# Patient Record
Sex: Male | Born: 1995 | Race: Black or African American | Hispanic: No | Marital: Single | Smoking: Never smoker
Health system: Southern US, Community
[De-identification: ages and names within clinical notes are randomized; demographics above are authoritative.]

## PROBLEM LIST (undated history)

## (undated) DIAGNOSIS — J45909 Unspecified asthma, uncomplicated: Secondary | ICD-10-CM

## (undated) HISTORY — PX: CIRCUMCISION: SUR203

---
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
## 2016-02-16 ENCOUNTER — Encounter: Payer: Self-pay | Admitting: Family Medicine

## 2016-02-16 ENCOUNTER — Ambulatory Visit (INDEPENDENT_AMBULATORY_CARE_PROVIDER_SITE_OTHER): Payer: No Typology Code available for payment source | Admitting: Family Medicine

## 2016-02-16 VITALS — BP 114/70 | HR 64 | Temp 97.8°F | Resp 16 | Ht 65.0 in | Wt 157.0 lb

## 2016-02-16 DIAGNOSIS — Z23 Encounter for immunization: Secondary | ICD-10-CM | POA: Diagnosis not present

## 2016-02-16 DIAGNOSIS — J301 Allergic rhinitis due to pollen: Secondary | ICD-10-CM

## 2016-02-16 DIAGNOSIS — J309 Allergic rhinitis, unspecified: Secondary | ICD-10-CM | POA: Insufficient documentation

## 2016-02-16 DIAGNOSIS — Z111 Encounter for screening for respiratory tuberculosis: Secondary | ICD-10-CM

## 2016-02-16 DIAGNOSIS — J45990 Exercise induced bronchospasm: Secondary | ICD-10-CM | POA: Insufficient documentation

## 2016-02-16 DIAGNOSIS — L309 Dermatitis, unspecified: Secondary | ICD-10-CM | POA: Insufficient documentation

## 2016-02-16 NOTE — Patient Instructions (Signed)
We will call you with the TB test result and provide a copy

## 2016-02-16 NOTE — Progress Notes (Signed)
Patient ID: Brent Ingram, male   DOB: 03/19/1996, 20 y.o.   MRN: [id number] Chief Complaint  Patient presents with  . Immunizations    Patient needs updated immunizations to transfer colleges  States he is [place] [employer] [place] [place] to [employer] Requires second Meningitis B and a TB screen.  1. Need for meningococcal vaccination - Meningococcal B, OMV  2. Tuberculosis screening - Quantiferon tb gold assay (blood)  Plan: Further f/u pending TB test results. Copy of [employer] provided for his college form.

## 2016-02-17 ENCOUNTER — Telehealth: Payer: Self-pay | Admitting: Family Medicine

## 2016-02-17 NOTE — Telephone Encounter (Signed)
Mom called wanting to know if paperwork from his visit yesterday is ready to be picked up.  Please advise.  Thanks Fortune Brandsteri

## 2016-02-17 NOTE — Telephone Encounter (Signed)
Left message to call back  

## 2016-02-17 NOTE — Telephone Encounter (Signed)
Tb test is not back yet

## 2016-02-18 NOTE — Telephone Encounter (Signed)
Pt's mom returned call. I advised the results are not back yet. Thanks TNP

## 2016-02-21 LAB — QUANTIFERON IN TUBE
QFT TB AG MINUS NIL VALUE: <0 IU/mL
QUANTIFERON MITOGEN VALUE: 8.55 IU/mL
QUANTIFERON TB AG VALUE: 0.03 [IU]/mL
QUANTIFERON TB GOLD: NEGATIVE
Quantiferon Nil Value: 0.07 IU/mL

## 2016-02-21 LAB — QUANTIFERON TB GOLD ASSAY (BLOOD)

## 2016-06-02 ENCOUNTER — Ambulatory Visit (INDEPENDENT_AMBULATORY_CARE_PROVIDER_SITE_OTHER): Payer: No Typology Code available for payment source | Admitting: Family Medicine

## 2016-06-02 ENCOUNTER — Encounter: Payer: Self-pay | Admitting: Family Medicine

## 2016-06-02 VITALS — BP 106/62 | HR 60 | Temp 98.3°F | Resp 16 | Ht 65.0 in | Wt 152.4 lb

## 2016-06-02 DIAGNOSIS — I861 Scrotal varices: Secondary | ICD-10-CM

## 2016-06-02 DIAGNOSIS — Z0000 Encounter for general adult medical examination without abnormal findings: Secondary | ICD-10-CM | POA: Diagnosis not present

## 2016-06-02 DIAGNOSIS — J45990 Exercise induced bronchospasm: Secondary | ICD-10-CM

## 2016-06-02 NOTE — Progress Notes (Signed)
Subjective:     Patient ID: Brent AshingSteven C Bittel, male   DOB: 08/13/1996, 20 y.o.   MRN: [id number]  HPI  Chief Complaint  Patient presents with  . Annual Exam    Patient comes in office today for his annual physical he states that he has no questions or concerns today. Patient is due today for flu vaccine but has declined.   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   Review of Systems General: Feeling well, reports occasional alcohol use; immunizations UTD HEENT: regular dental visits and recent eye exam. Wears contact lenses. Cardiovascular: no chest pain, shortness of breath, or palpitations GI: no heartburn, no change in bowel habits  GU: , no change in bladder habits; sexually active but uses condoms. Reports left varicocele is less apparent now. Psychiatric: not depressed Musculoskeletal: no joint pain    Objective:   Physical Exam  Constitutional: He appears well-developed and well-nourished. No distress.  Eyes: PERRLA Ears: TM's intact without inflammation Mouth: No tonsillar enlargement, erythema or exudate Neck: supple with  FROM and no cervical adenopathy, thyromegaly, tenderness or nodules Lungs: clear Heart: RRR without murmur  Abd: soft, nontender. GU: no hernia, mild left varicocele Extremities: Muscle strength 5/5 in upper and lower extremities. Shoulders, elbows, and wrists with FROM. Knee and ankle ligaments stable; no tibial tubercle tenderness.      Assessment:    1. Annual physical exam  2. Exercise-induced asthma: albuterol MDI prn  3. Left varicocele     Plan:   sports form completed

## 2016-06-02 NOTE — Patient Instructions (Signed)
Let me know if you need refill on your inhaler.

## 2016-06-13 ENCOUNTER — Other Ambulatory Visit: Payer: Self-pay | Admitting: Family Medicine

## 2016-06-13 DIAGNOSIS — J45990 Exercise induced bronchospasm: Secondary | ICD-10-CM

## 2016-12-02 ENCOUNTER — Emergency Department: Payer: Commercial Managed Care - HMO

## 2016-12-02 ENCOUNTER — Emergency Department
Admission: EM | Admit: 2016-12-02 | Discharge: 2016-12-02 | Disposition: A | Discharge status: Home / Self Care | Payer: Commercial Managed Care - HMO | Attending: Emergency Medicine | Admitting: Emergency Medicine

## 2016-12-02 ENCOUNTER — Encounter: Payer: Self-pay | Admitting: Emergency Medicine

## 2016-12-02 DIAGNOSIS — Y998 Other external cause status: Secondary | ICD-10-CM | POA: Diagnosis not present

## 2016-12-02 DIAGNOSIS — W1839XA Other fall on same level, initial encounter: Secondary | ICD-10-CM | POA: Diagnosis not present

## 2016-12-02 DIAGNOSIS — S86912A Strain of unspecified muscle(s) and tendon(s) at lower leg level, left leg, initial encounter: Secondary | ICD-10-CM | POA: Diagnosis not present

## 2016-12-02 DIAGNOSIS — Y9302 Activity, running: Secondary | ICD-10-CM | POA: Insufficient documentation

## 2016-12-02 DIAGNOSIS — S8992XA Unspecified injury of left lower leg, initial encounter: Secondary | ICD-10-CM | POA: Diagnosis present

## 2016-12-02 DIAGNOSIS — Y92219 Unspecified school as the place of occurrence of the external cause: Secondary | ICD-10-CM | POA: Insufficient documentation

## 2016-12-02 MED ORDER — MELOXICAM 15 MG PO TABS: 15.0000 mg | ORAL_TABLET | Freq: Every day | ORAL | 0 refills | Status: DC

## 2016-12-02 NOTE — ED Notes (Signed)
Pt. Going home by self. 

## 2016-12-02 NOTE — ED Triage Notes (Signed)
Fell yesterday on campus running. Fell on left knee. Pain bending and standing

## 2016-12-02 NOTE — ED Notes (Signed)
Pt ambulatory with no difficulty; pt says he fell yesterday and is having pain to his left knee;

## 2016-12-02 NOTE — ED Provider Notes (Signed)
[facility] Emergency Department Provider Note ____________________________________________  Time seen: Approximately 8:56 PM  I have reviewed the triage vital signs and the nursing notes.   HISTORY  Chief Complaint Knee Injury    HPI Brent Ingram is a 21 y.o. male who presents to the emergency department for evaluation of left knee pain. He states that yesterday, he was running on the schools campus and he fell and landed on his left knee. Pain increases with bending and standing.He has not taken any over the counter medications to alleviate his pain.  History reviewed. No pertinent past medical history.  Patient Active Problem List   Diagnosis Date Noted  . Exercise-induced asthma 02/16/2016  . Eczema 02/16/2016  . Allergic rhinitis 02/16/2016  . Left varicocele 04/23/2015    Past Surgical History:  Procedure Laterality Date  . CIRCUMCISION    . CIRCUMCISION      Prior to Admission medications   Medication Sig Start Date End Date Taking? Authorizing Provider  meloxicam (MOBIC) 15 MG tablet Take 1 tablet (15 mg total) by mouth daily. 12/02/16   Chinita Pester, FNP  PROAIR HFA 108 (90 Base) MCG/ACT inhaler INHALE TWO PUFFS BY MOUTH EVERY 4 TO 6 HOURS AS NEEDED FOR WHEEZING 06/13/16   Anola Gurney, PA    Allergies Patient has no known allergies.  History reviewed. No pertinent family history.  Social History Social History  Substance Use Topics  . Smoking status: Never Smoker  . Smokeless tobacco: Never Used  . Alcohol use No    Review of Systems Constitutional: No recent illness. Cardiovascular: Denies chest pain or palpitations. Respiratory: Denies shortness of breath. Musculoskeletal: Pain in left anterior knee. Skin: Negative for rash, wound, lesion. Neurological: Negative for focal weakness or numbness.  ____________________________________________   PHYSICAL EXAM:  VITAL SIGNS: ED Triage Vitals  Enc Vitals Group    BP 12/02/16 2042 (!) 145/75     Pulse Rate 12/02/16 2039 65     Resp 12/02/16 2039 16     Temp 12/02/16 2039 98.8 F (37.1 C)     Temp Source 12/02/16 2039 Oral     SpO2 12/02/16 2039 100 %     Weight 12/02/16 2040 150 lb (68 kg)     Height 12/02/16 2040 5' 5.5" (1.664 m)     Head Circumference --      Peak Flow --      Pain Score 12/02/16 2039 7     Pain Loc --      Pain Edu? --      Excl. in GC? --     Constitutional: Alert and oriented. Well appearing and in no acute distress. Eyes: Conjunctivae are normal. EOMI. Head: Atraumatic. Neck: No stridor.  Respiratory: Normal respiratory effort.   Musculoskeletal: Negative ballottement test of the left knee. No obvious deformity or joint effusion. Neurologic:  Normal speech and language. No gross focal neurologic deficits are appreciated. Speech is normal. No gait instability. Skin:  Skin is warm, dry and intact. Atraumatic. Psychiatric: Mood and affect are normal. Speech and behavior are normal.  ____________________________________________   LABS (all labs ordered are listed, but only abnormal results are displayed)  Labs Reviewed - No data to display ____________________________________________  RADIOLOGY  Left knee negative for acute bony abnormality per radiology. ____________________________________________   PROCEDURES  Procedure(s) performed: None  ____________________________________________   INITIAL IMPRESSION / ASSESSMENT AND PLAN / ED COURSE  21 year old male presenting to the emergency department for evaluation of left  knee pain after a mechanical, non-syncopal fall yesterday. X-ray and exam are consistent. No fracture. Patient was instructed to wear a neoprene knee sleeve and rest, ice, and elevate the extremity for the next few days. He was instructed to follow-up with the orthopedist for symptoms that are not improving over the week. He was instructed to return to the emergency department for symptoms  that change or worsen if he is unable to schedule an appointment.  Pertinent labs & imaging results that were available during my care of the patient were reviewed by me and considered in my medical decision making (see chart for details).  _________________________________________   FINAL CLINICAL IMPRESSION(S) / ED DIAGNOSES  Final diagnoses:  Strain of left knee, initial encounter    New Prescriptions   MELOXICAM (MOBIC) 15 MG TABLET    Take 1 tablet (15 mg total) by mouth daily.    If controlled substance prescribed during this visit, 12 month history viewed on the [facility] prior to issuing an initial prescription for Schedule II or III opiod.    Chinita Pester, FNP 12/02/16 2213    Merrily Brittle, MD 12/03/16 1420

## 2017-10-05 DIAGNOSIS — Z0000 Encounter for general adult medical examination without abnormal findings: Secondary | ICD-10-CM | POA: Diagnosis not present

## 2018-03-20 ENCOUNTER — Encounter: Payer: Self-pay | Admitting: Family Medicine

## 2018-03-20 ENCOUNTER — Ambulatory Visit (INDEPENDENT_AMBULATORY_CARE_PROVIDER_SITE_OTHER): Payer: 59 | Admitting: Family Medicine

## 2018-03-20 VITALS — BP 120/80 | HR 67 | Temp 98.7°F | Resp 16 | Ht 65.5 in | Wt 155.2 lb

## 2018-03-20 DIAGNOSIS — Z0000 Encounter for general adult medical examination without abnormal findings: Secondary | ICD-10-CM

## 2018-03-20 DIAGNOSIS — I861 Scrotal varices: Secondary | ICD-10-CM | POA: Diagnosis not present

## 2018-03-20 DIAGNOSIS — L74510 Primary focal hyperhidrosis, axilla: Secondary | ICD-10-CM | POA: Diagnosis not present

## 2018-03-20 DIAGNOSIS — J45990 Exercise induced bronchospasm: Secondary | ICD-10-CM | POA: Diagnosis not present

## 2018-03-20 MED ORDER — ALBUTEROL SULFATE HFA 108 (90 BASE) MCG/ACT IN AERS
INHALATION_SPRAY | RESPIRATORY_TRACT | 2 refills | Status: DC
Start: 2018-03-20 — End: 2019-09-08

## 2018-03-20 MED ORDER — ALUMINUM CHLORIDE 20 % EX SOLN: Freq: Every day | CUTANEOUS | 1 refills | Status: DC

## 2018-03-20 NOTE — Patient Instructions (Signed)
Let me know if the medication for your sweating does not work for you.

## 2018-03-20 NOTE — Progress Notes (Signed)
  Subjective:     Patient ID: Brent Ingram, male   DOB: 08/29/1995, 22 y.o.   MRN: [id number] Chief Complaint  Patient presents with  . Annual Exam    Patient comes in office today for his annual physial he states that he is feeling well today and would like to discuss issues with perspiration, patient states that he has tried various deodorants and nothing seems to help with sweating. Patient reports that he is following a well balanced diet and is actively exercising 5x a week, patient reports that he sleeps on average 6-7hrs a night. Patient is up to date on all vaccines.    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  Review of Systems General: Feeling well HEENT: regular dental visits and eye exams (glasses) Cardiovascular: no chest pain, shortness of breath, or palpitations. Use albuterol inhaler before performing. GI: no heartburn, no change in bowel habits or blood in the stool GU:  no change in bladder habits. States he is sexually active with condom use. Reports his school offers STD testing which he participates in.  Psychiatric: not depressed Musculoskeletal: no joint pain    Objective:   Physical Exam  Constitutional: He appears well-developed and well-nourished. No distress.  Eyes: PERRLA, Mild left exotropia (chronic) is present Neck: no thyromegaly, tenderness or nodules, no cervical adenopathy ENT: TM's intact without inflammation; No tonsillar enlargement or exudate, Lungs: Clear Heart : RRR without murmur or gallop Abd: bowel sounds present, soft, non-tender, no organomegaly GU: No testicle mass; varicocele not apparent on supine exam Extremities: no edema, pedal pulses intact Skin: no atypical lesions noted.     Assessment:    1. Annual physical exam  2. Hyperhidrosis of axilla - aluminum chloride (DRYSOL) 20 % external solution; Apply topically at  bedtime. Wash off in AM. After sweating improved use once or twice weekly  Dispense: 60 mL; Refill: 1  3. Left varicocele  4. Exercise-induced asthma - albuterol (PROAIR HFA) 108 (90 Base) MCG/ACT inhaler; Two inhalations prior to exercise. May repeat ever 4 hours as needed  Dispense: 18 g; Refill: 2    Plan:    Call if medication not helping.

## 2018-03-20 NOTE — Progress Notes (Deleted)
  Subjective:     Patient ID: Brent Ingram, male   DOB: 10/09/1995, 22 y.o.   MRN: [id number]  HPI   Review of Systems     Objective:   Physical Exam     Assessment:     ***    Plan:     ***

## 2018-03-28 IMAGING — DX DG KNEE COMPLETE 4+V*L*
5 series · 5 of 5 positions shown · non-contrast
Comparison: None.

CLINICAL DATA: Persistent pain after falling on left knee yesterday

EXAM:
LEFT KNEE - COMPLETE 4+ VIEW

[knee ap]
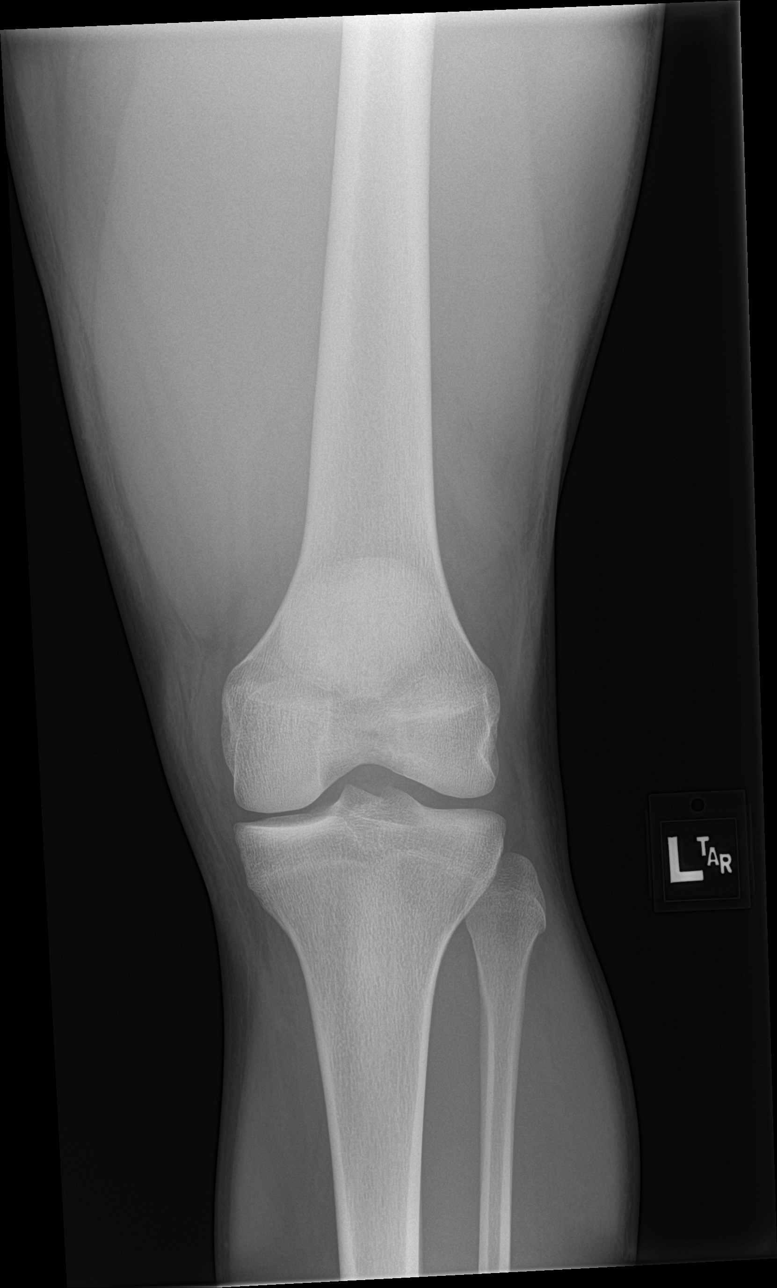

[knee lat]
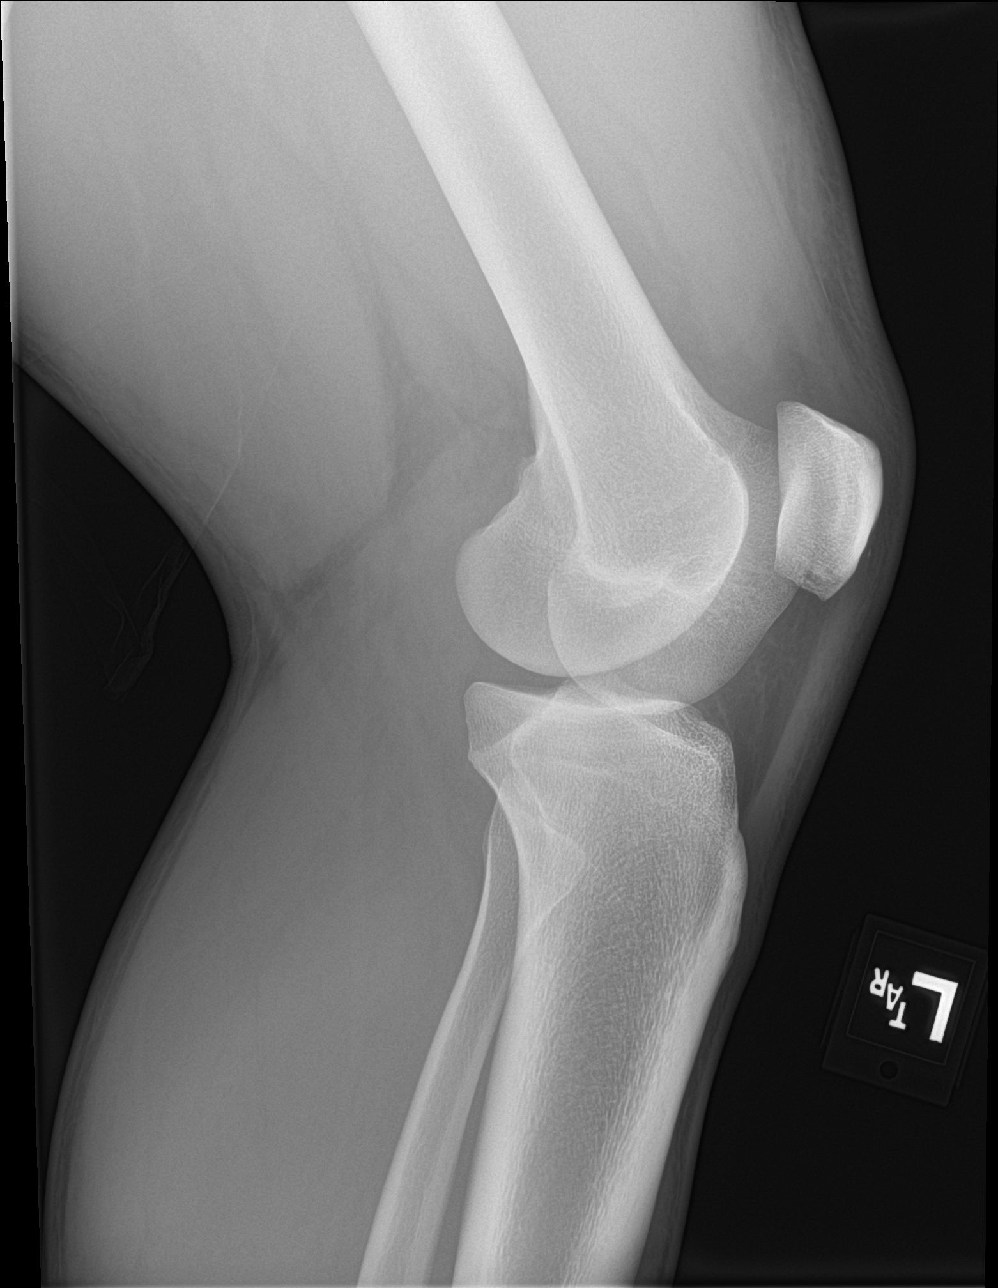

[knee obl (1 of 3)]
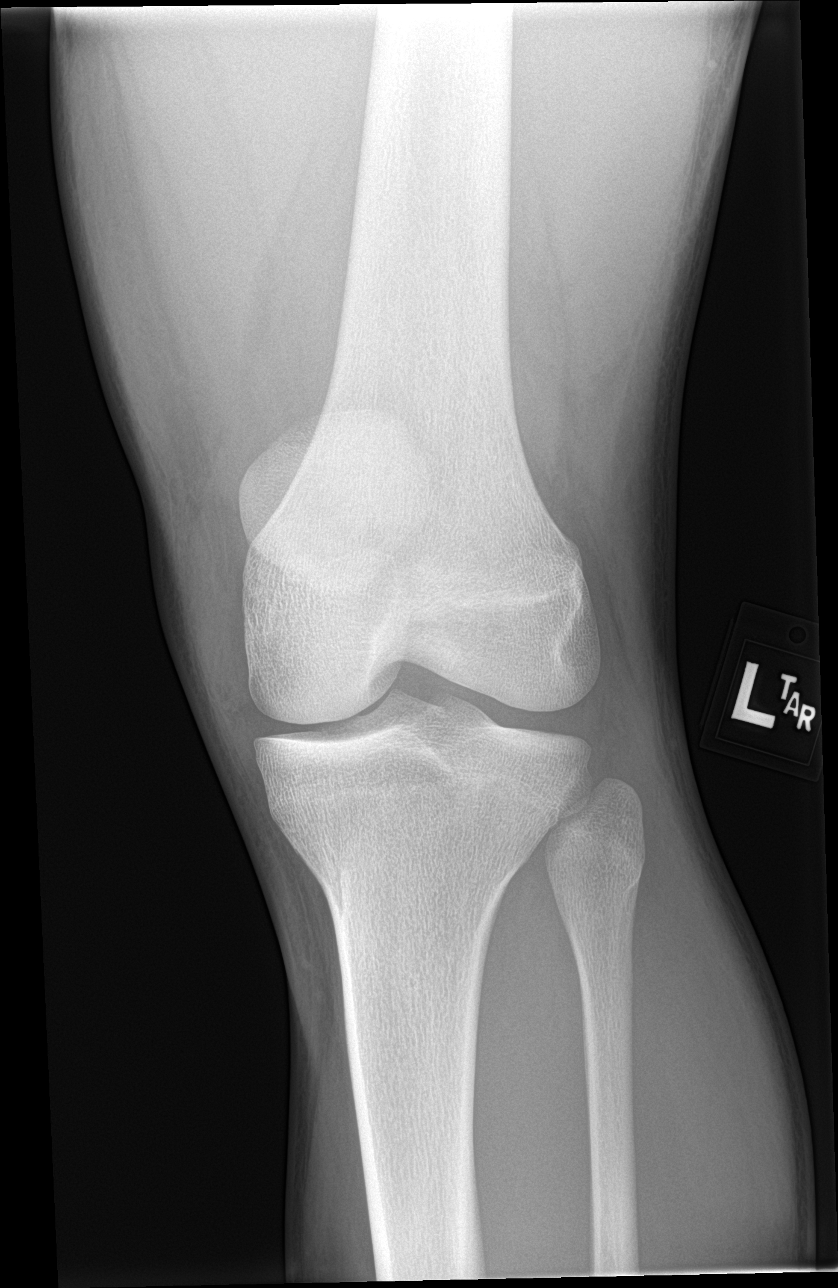

[knee obl (2 of 3)]
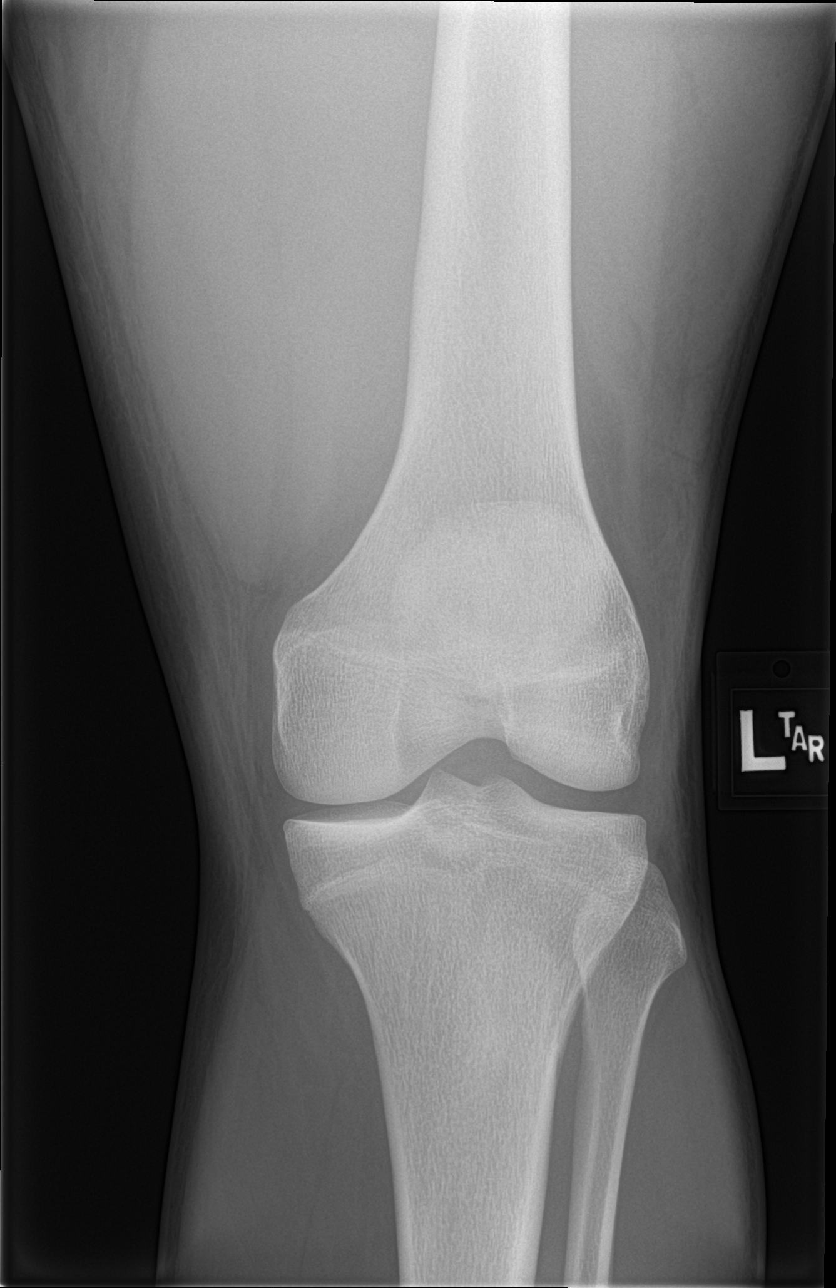

[knee obl (3 of 3)]
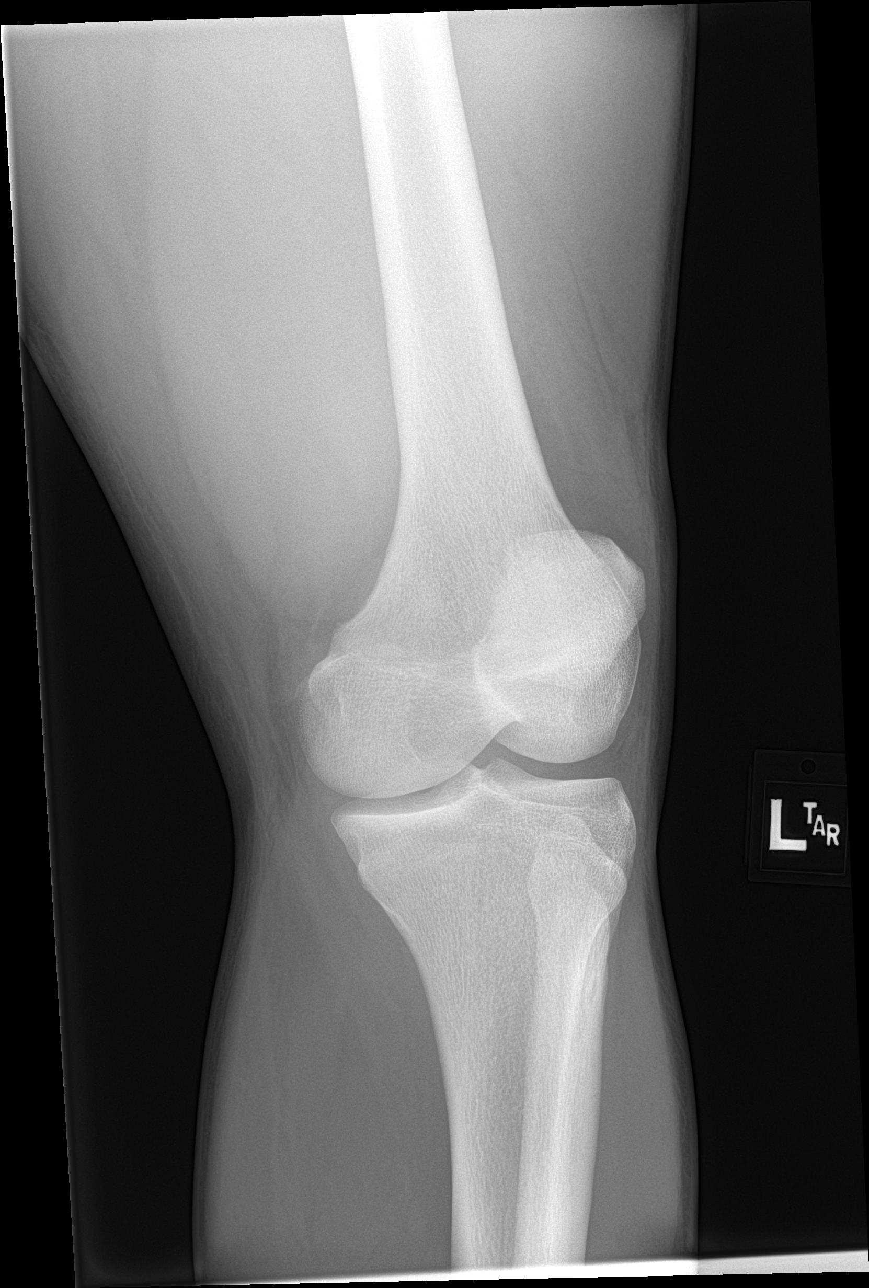

[5 of 5 positions shown; findings below may reference images not displayed]

FINDINGS: No evidence of fracture, dislocation, or joint effusion. No evidence
of arthropathy or other focal bone abnormality. Soft tissues are
unremarkable.
IMPRESSION: Negative.

## 2018-09-08 ENCOUNTER — Emergency Department (HOSPITAL_COMMUNITY)
Admission: EM | Admit: 2018-09-08 | Discharge: 2018-09-09 | Disposition: A | Discharge status: Home / Self Care | Payer: 59 | Attending: Emergency Medicine | Admitting: Emergency Medicine

## 2018-09-08 ENCOUNTER — Encounter (HOSPITAL_COMMUNITY): Payer: Self-pay | Admitting: Emergency Medicine

## 2018-09-08 DIAGNOSIS — R0789 Other chest pain: Secondary | ICD-10-CM | POA: Diagnosis not present

## 2018-09-08 DIAGNOSIS — R0602 Shortness of breath: Secondary | ICD-10-CM | POA: Insufficient documentation

## 2018-09-08 DIAGNOSIS — J45909 Unspecified asthma, uncomplicated: Secondary | ICD-10-CM | POA: Diagnosis not present

## 2018-09-08 DIAGNOSIS — R079 Chest pain, unspecified: Secondary | ICD-10-CM | POA: Diagnosis present

## 2018-09-08 HISTORY — DX: Unspecified asthma, uncomplicated: J45.909

## 2018-09-08 MED ORDER — IPRATROPIUM-ALBUTEROL 0.5-2.5 (3) MG/3ML IN SOLN
3.0000 mL | Freq: Once | RESPIRATORY_TRACT | Status: AC
  Administered 2018-09-08: 3 mL via RESPIRATORY_TRACT
  Filled 2018-09-08: qty 3

## 2018-09-08 NOTE — ED Triage Notes (Addendum)
Reports sudden onset of chest pain and sob while in [employer] this evening.  Reports it went away but came back out of nowhere.  Tried using inhaler X 2 pta with no relief.  Patient reports during triage that his throat feels like its closing up.  Some swelling noted to back of throat.

## 2018-09-09 ENCOUNTER — Emergency Department (HOSPITAL_COMMUNITY): Payer: 59

## 2018-09-09 NOTE — ED Provider Notes (Signed)
TIME SEEN: 12:08 AM  CHIEF COMPLAINT: Chest pain, shortness of breath  HPI: Patient is a 23 year old male with history of asthma who presents to the emergency department with chest pain and shortness of breath that started tonight while walking around [employer] at 7 PM.  States he felt like his chest was tight.  Called his mother who suggested he try his inhaler.  Tried his inhaler around 9 PM which helped his symptoms.  States symptoms then came back and he felt like his throat was closing up.  States "I do not know if I was having a panic attack or what".  Decided to come to the emergency department for further evaluation.  Denies any fevers, cough, wheezing.  No history of CAD.  No history of PE, DVT, exogenous estrogen use, recent fractures, surgery, trauma, hospitalization or prolonged travel. No lower extremity swelling or pain. No calf tenderness.  No rash.  No lip or tongue swelling.  No changes in his voice.  He denies any new exposures to foods, lotions, detergents, medications, animals, etc.  ROS: See HPI Constitutional: no fever  Eyes: no drainage  ENT: no runny nose   Cardiovascular:   chest pain  Resp:  SOB  GI: no vomiting GU: no dysuria Integumentary: no rash  Allergy: no hives  Musculoskeletal: no leg swelling  Neurological: no slurred speech ROS otherwise negative  PAST MEDICAL HISTORY/PAST SURGICAL HISTORY:  Past Medical History:  Diagnosis Date  . Asthma     MEDICATIONS:  Prior to Admission medications   Medication Sig Start Date End Date Taking? Authorizing Provider  albuterol (PROAIR HFA) 108 (90 Base) MCG/ACT inhaler Two inhalations prior to exercise. May repeat ever 4 hours as needed 03/20/18   Anola Gurney, PA  aluminum chloride (DRYSOL) 20 % external solution Apply topically at bedtime. Wash off in AM. After sweating improved use once or twice weekly 03/20/18   Anola Gurney, PA    ALLERGIES:  No Known Allergies  SOCIAL HISTORY:  Social History    Tobacco Use  . Smoking status: Never Smoker  . Smokeless tobacco: Never Used  Substance Use Topics  . Alcohol use: No    FAMILY HISTORY: No family history on file.  EXAM: BP 135/80   Pulse (!) 59   Temp 98.9 F (37.2 C) (Oral)   Resp  19   Ht 5' 5.5" (1.664 m)   Wt 68 kg   SpO2 100%   BMI 24.58 kg/m  CONSTITUTIONAL: Alert and oriented and responds appropriately to questions. Well-appearing; well-nourished HEAD: Normocephalic EYES: Conjunctivae clear, pupils appear equal, EOMI ENT: normal nose; moist mucous membranes; No pharyngeal erythema or petechiae, no tonsillar hypertrophy or exudate, no uvular deviation, no unilateral swelling, no trismus or drooling, no muffled voice, normal phonation, no stridor, no dental caries present, no drainable dental abscess noted, no Ludwig's angina, tongue sits flat in the bottom of the mouth, no angioedema, no facial erythema or warmth, no facial swelling; no pain with movement of the neck. NECK: Supple, no meningismus, no nuchal rigidity, no LAD  CARD: RRR; S1 and S2 appreciated; no murmurs, no clicks, no rubs, no gallops RESP: Normal chest excursion without splinting or tachypnea; breath sounds clear and equal bilaterally; no wheezes, no rhonchi, no rales, no hypoxia or respiratory distress, speaking full sentences ABD/GI: Normal bowel sounds; non-distended; soft, non-tender, no rebound, no guarding, no peritoneal signs, no hepatosplenomegaly BACK:  The back appears normal and is non-tender to palpation, there is no CVA tenderness EXT:  Normal ROM in all joints; non-tender to palpation; no edema; normal capillary refill; no cyanosis, no calf tenderness or swelling    SKIN: Normal color for age and race; warm; no rash NEURO: Moves all extremities equally PSYCH: The patient's mood and manner are appropriate. Grooming and personal hygiene are appropriate.  MEDICAL DECISION MAKING: Patient here with chest pain.  He has no risk factors for CAD or  PE.  He is PERC negative.  EKG shows no ischemic changes.  No sign of allergic reaction.  No angioedema.  Normal voice.  No rash.  He does report his inhaler helped his symptoms previously.  Will give DuoNeb treatment here for symptomatic relief.  Lungs are currently clear.  Will obtain chest x-ray.  Doubt ACS, PE, dissection.  No signs of volume overload.  No symptoms of infection.  ED PROGRESS: Patient reports feeling much better.  Suspect bronchospasm as the cause of his symptoms.  He has an inhaler at home.  Reports he does not need a refill.  Will follow-up with his primary care doctor.  His chest x-ray today is clear.  Patient's family at bedside agrees with this plan.  Discussed return precautions.   At this time, I do not feel there is any life-threatening condition present. I have reviewed and discussed all results (EKG, imaging, lab, urine as appropriate) and exam findings with patient/family. I have reviewed nursing notes and appropriate previous records.  I feel the patient is safe to be discharged home without further emergent workup and can continue workup as an outpatient as needed. Discussed usual and customary return precautions. Patient/family verbalize understanding and are comfortable with this plan.  Outpatient follow-up has been provided as needed. All questions have been answered.      EKG Interpretation  Date/Time:  Sunday September 08 2018 23:36:57 EST Ventricular Rate:  64 PR Interval:    QRS Duration: 90 QT Interval:  368 QTC Calculation: 380 R Axis:   77 Text Interpretation:  Sinus rhythm No old tracing to compare Confirmed by Nikole Swartzentruber, Baxter Hire [phone]) on 09/09/2018 12:07:46 AM         Audrianna Driskill, Layla Maw, DO 09/09/18 0128

## 2018-09-13 ENCOUNTER — Telehealth: Payer: Self-pay | Admitting: Family Medicine

## 2018-09-13 ENCOUNTER — Ambulatory Visit: Payer: 59 | Admitting: Family Medicine

## 2018-09-13 ENCOUNTER — Encounter: Payer: Self-pay | Admitting: Family Medicine

## 2018-09-13 ENCOUNTER — Other Ambulatory Visit: Payer: Self-pay

## 2018-09-13 ENCOUNTER — Other Ambulatory Visit: Payer: Self-pay | Admitting: Family Medicine

## 2018-09-13 VITALS — BP 126/74 | HR 60 | Temp 98.0°F | Ht 65.5 in | Wt 160.8 lb

## 2018-09-13 DIAGNOSIS — J4530 Mild persistent asthma, uncomplicated: Secondary | ICD-10-CM | POA: Diagnosis not present

## 2018-09-13 DIAGNOSIS — J45990 Exercise induced bronchospasm: Secondary | ICD-10-CM | POA: Diagnosis not present

## 2018-09-13 MED ORDER — BECLOMETHASONE DIPROP HFA 40 MCG/ACT IN AERB: 1.0000 | INHALATION_SPRAY | Freq: Two times a day (BID) | RESPIRATORY_TRACT | 1 refills | Status: DC

## 2018-09-13 MED ORDER — BUDESONIDE 90 MCG/ACT IN AEPB: 1.0000 | INHALATION_SPRAY | Freq: Two times a day (BID) | RESPIRATORY_TRACT | 1 refills | Status: DC

## 2018-09-13 NOTE — Telephone Encounter (Signed)
Please review

## 2018-09-13 NOTE — Progress Notes (Signed)
  Subjective:     Patient ID: CASHIUS HOLMEN, male   DOB: [birth year], 23 y.o.   MRN: [id number] Chief Complaint  Patient presents with  . Asthma   HPI States he was in the ER 1/12 after having exacerbation of his asthma. States he previously was just using albuterol prior to exercise. Now he has been exposed to smoking roommates and has been using the albuterol twice a week.  Review of Systems     Objective:   Physical Exam Constitutional:      General: He is not in acute distress. Cardiovascular:     Rate and Rhythm: Normal rate and regular rhythm.  Pulmonary:     Effort: Pulmonary effort is normal.     Breath sounds: No wheezing.  Neurological:     Mental Status: He is alert.        Assessment:    1. Exercise-induced asthma; continue albuterol as needed  2. Mild persistent reactive airway disease without complication: will use maintenance inhaler until he can change his living situation. - Budesonide 90 MCG/ACT inhaler; Inhale 1 puff into the lungs 2 (two) times daily. Rinse mouth after use.  Dispense: 1 Inhaler; Refill: 1    Plan:    Further f/u if not improving.

## 2018-09-13 NOTE — Telephone Encounter (Signed)
Sent in Qvar 40

## 2018-09-13 NOTE — Patient Instructions (Signed)
Let me know if the inhaler helps. You may still use the albuterol as needed.

## 2018-09-13 NOTE — Telephone Encounter (Signed)
budesonide 90 MCG/ACT inhaler - pt's insurance will not cover this Rx. Asking for another similar medication that can be filled/covered by his insurance.  Please call into:  [facility] [address] [place] - [address] [phone] (Phone) [phone] (Fax)   Thanks, [facility]

## 2018-09-13 NOTE — Telephone Encounter (Signed)
Pt advised of inhaler rx sent to pharmacy

## 2018-09-13 NOTE — Telephone Encounter (Signed)
Please call the pharmacy and see which steroid inhalers are covered by his insurance

## 2018-09-13 NOTE — Telephone Encounter (Signed)
Spoke to pharmacist and the Q-var Redi haler in 40 or 79 and is covered with a $10 co-pay

## 2018-10-24 ENCOUNTER — Ambulatory Visit (INDEPENDENT_AMBULATORY_CARE_PROVIDER_SITE_OTHER): Payer: 59 | Admitting: Family Medicine

## 2018-10-24 ENCOUNTER — Encounter: Payer: Self-pay | Admitting: Family Medicine

## 2018-10-24 VITALS — BP 112/68 | HR 102 | Temp 103.1°F | Resp 16 | Wt 163.4 lb

## 2018-10-24 DIAGNOSIS — B349 Viral infection, unspecified: Secondary | ICD-10-CM | POA: Diagnosis not present

## 2018-10-24 MED ORDER — OSELTAMIVIR PHOSPHATE 75 MG PO CAPS: 75.0000 mg | ORAL_CAPSULE | Freq: Two times a day (BID) | ORAL | 0 refills | Status: DC

## 2018-10-24 NOTE — Patient Instructions (Signed)
Continue Theraflu. May use Delsym for cough. When your fever has been gone for 24 hours less likely to transmit the infection.

## 2018-10-24 NOTE — Progress Notes (Signed)
  Subjective:     Patient ID: Brent Ingram, male   DOB: 1996-05-29, 23 y.o.   MRN: [id number] Chief Complaint  Patient presents with  . Fever    Patient comes in office today with complaints of fever, fatigue and sore throat. Patient reports that he has noticed chest congestion and cough productive of mucous. Patient reports that he has trie otc cough drops and Theraflu  . Medical Clearance    Patient has brought with him today form to be filled out by PCP   HPI Reports flu like sx though states body aches and sore throat have abated No flu shot this season. Also has a medical clearance form for the [employer].  Review of Systems     Objective:   Physical Exam Constitutional:      General: He is not in acute distress.    Appearance: He is ill-appearing.  Neurological:     Mental Status: He is alert.   Ears: T.M's intact without inflammation Throat: no tonsillar enlargement or exudate Neck: no cervical adenopathy Lungs: clear     Assessment:    1. Acute viral syndrome: c/w influenza-Tamiflu sent in    Plan:    Continue use of Theraflu and Delsym for cough. Form signed. Hx of mild exercise induced asthma.

## 2018-11-15 ENCOUNTER — Other Ambulatory Visit: Payer: Self-pay | Admitting: Family Medicine

## 2018-11-15 MED ORDER — BECLOMETHASONE DIPROP HFA 40 MCG/ACT IN AERB: 1.0000 | INHALATION_SPRAY | Freq: Two times a day (BID) | RESPIRATORY_TRACT | 1 refills | Status: DC

## 2018-11-15 NOTE — Telephone Encounter (Signed)
Can we make sure he is set up with a new provider?

## 2018-11-15 NOTE — Telephone Encounter (Signed)
[facility] refill request for the following medications:  beclomethasone (QVAR REDIHALER) 40 MCG/ACT inhaler     Please advise.

## 2018-11-15 NOTE — Telephone Encounter (Signed)
Patient advised. He states he will call back to schedule a appointment with a new provider before next refill is due.

## 2019-09-08 ENCOUNTER — Telehealth: Payer: Self-pay

## 2019-09-08 DIAGNOSIS — J45990 Exercise induced bronchospasm: Secondary | ICD-10-CM

## 2019-09-08 MED ORDER — QVAR REDIHALER 40 MCG/ACT IN AERB: 1.0000 | INHALATION_SPRAY | Freq: Two times a day (BID) | RESPIRATORY_TRACT | 1 refills | Status: DC

## 2019-09-08 MED ORDER — ALBUTEROL SULFATE HFA 108 (90 BASE) MCG/ACT IN AERS: INHALATION_SPRAY | RESPIRATORY_TRACT | 2 refills | Status: AC

## 2019-09-08 NOTE — Telephone Encounter (Signed)
Patient calling back about this request. Requesting a call back.

## 2019-09-08 NOTE — Addendum Note (Signed)
Addended by: Margaretann Loveless on: 09/08/2019 11:16 AM   Modules accepted: Orders

## 2019-09-08 NOTE — Telephone Encounter (Signed)
Sent in refills of his medications. OK to schedule f/u with new provider.

## 2019-09-08 NOTE — Telephone Encounter (Signed)
Copied from CRM [id number]. Topic: Appointment Scheduling - Scheduling Inquiry for Clinic >> Sep 05, 2019  4:53 PM Daphine Deutscher D wrote: Reason for CRM: pt called saying he was Bob's patient and needs to come in for medications for asthma.  He ask if he could start seeing Daiva Nakayama.  [facility] insurance  CB#  [phone]

## 2019-09-19 ENCOUNTER — Ambulatory Visit: Payer: 59 | Admitting: Physician Assistant

## 2019-10-06 NOTE — Progress Notes (Signed)
Patient: Brent Ingram, Male    DOB: [birth year], 24 y.o.   MRN: [id number] Visit Date: 10/09/2019  Today's Provider: Mar Daring, PA-C   Chief Complaint  Patient presents with  . Annual Exam   Subjective:     Annual physical exam Brent Ingram is a 24 y.o. male who presents today for health maintenance and complete physical. He feels well. He reports exercising . He reports he is sleeping well.  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 He had labs done through this physical and will send results through mychart once received.  -----------------------------------------------------------------   Review of Systems  Constitutional: Negative.   HENT: Negative.   Eyes: Negative.   Respiratory: Negative.   Cardiovascular: Negative.   Gastrointestinal: Negative.   Endocrine: Negative.   Musculoskeletal: Negative.   Skin: Negative.   Allergic/Immunologic: Negative.   Neurological: Negative.   Hematological: Negative.   Psychiatric/Behavioral: Negative.     Social History      He  reports that he has never smoked. He has never used smokeless tobacco. He reports that he does not drink alcohol or use drugs.       Social History   Socioeconomic History  . Marital status: Single    Spouse name: Not on file  . Number of children: Not on file  . Years of education: Not on file  . Highest education level: Not on file  Occupational History  . Not on file  Tobacco Use  . Smoking status: Never Smoker  . Smokeless tobacco: Never Used  Substance and Sexual Activity  . Alcohol use: No  . Drug use: No  . Sexual activity: Yes    Birth control/protection: Condom  Other Topics Concern  . Not on file  Social History Narrative  . Not on file   Social Determinants of Health   Financial Resource Strain:    . Difficulty of Paying Living Expenses: Not on file  Food Insecurity:   . Worried About Charity fundraiser in the Last Year: Not on file  . Ran Out of Food in the Last Year: Not on file  Transportation Needs:   . Lack of Transportation (Medical): Not on file  . Lack of Transportation (Non-Medical): Not on file  Physical Activity:   . Days of Exercise per Week: Not on file  . Minutes of Exercise per Session: Not on file  Stress:   . Feeling of Stress : Not on file  Social Connections:   . Frequency of Communication with Friends and Family: Not on file  . Frequency of Social Gatherings with Friends and Family: Not on file  . Attends Religious Services: Not on file  . Active Member of Clubs or Organizations: Not on file  . Attends Archivist Meetings: Not on file  . Marital Status: Not on file    Past Medical History:  Diagnosis Date  . Asthma      Patient Active Problem List   Diagnosis Date Noted  . Exercise-induced asthma 02/16/2016  . Eczema 02/16/2016  . Allergic rhinitis 02/16/2016  . Left varicocele 04/23/2015    Past Surgical History:  Procedure Laterality Date  . CIRCUMCISION    . CIRCUMCISION      Family History  Family Status  Relation Name Status  . Mother  Alive  . Father  Alive  . Sister (twin) Alive  . MGM  Alive  . MGF  Alive  . PGM  Alive  . PGF  Alive        His family history is not on file.      No Known Allergies   Current Outpatient Medications:  .  albuterol (PROAIR HFA) 108 (90 Base) MCG/ACT inhaler, Two inhalations prior to exercise. May repeat ever 4 hours as needed, Disp: 18 g, Rfl: 2   Patient Care Team: Mar Daring, PA-C as PCP - General (Family Medicine)    Objective:    Vitals: BP 135/75 (BP Location: Left Arm, Patient Position: Sitting, Cuff Size: Normal)   Pulse 66   Temp (!) 96.9 F (36.1 C) (Temporal)   Resp 16   Ht 5' 5.5" (1.664 m)   Wt 165 lb 9.6 oz (75.1 kg)   BMI 27.14 kg/m     Vitals:   10/09/19 0859  BP: 135/75  Pulse: 66  Resp: 16  Temp: (!) 96.9 F (36.1 C)  TempSrc: Temporal  Weight: 165 lb 9.6 oz (75.1 kg)  Height: 5' 5.5" (1.664 m)     Physical Exam Vitals reviewed.  Constitutional:      General: He is not in acute distress.    Appearance: Normal appearance. He is normal weight. He is not ill-appearing.  HENT:     Head: Normocephalic and atraumatic.     Right Ear: Tympanic membrane, ear canal and external ear normal.     Left Ear: Tympanic membrane and external ear normal.  Eyes:     General: No scleral icterus.       Right eye: No discharge.        Left eye: No discharge.     Extraocular Movements: Extraocular movements intact.     Conjunctiva/sclera: Conjunctivae normal.     Pupils: Pupils are equal, round, and reactive to light.  Cardiovascular:     Rate and Rhythm: Normal rate and regular rhythm.     Pulses: Normal pulses.     Heart sounds: Normal heart sounds.  Pulmonary:     Effort: Pulmonary effort is normal.     Breath sounds: Normal breath sounds.  Abdominal:     General: Abdomen is flat. Bowel sounds are normal. There is no distension.     Palpations: Abdomen is soft. There is no mass.     Tenderness: There is no abdominal tenderness.     Hernia: No hernia is present.  Musculoskeletal:        General: Normal range of motion.     Cervical back: Normal range of motion and neck supple.     Right lower leg: No edema.     Left lower leg: No edema.  Lymphadenopathy:     Cervical: No cervical adenopathy.  Skin:    General: Skin is warm and dry.     Capillary Refill: Capillary refill takes less than 2 seconds.  Neurological:     General: No focal deficit present.     Mental Status: He is alert and oriented to person, place, and time. Mental status is at baseline.     Gait: Gait normal.  Psychiatric:        Mood and Affect: Mood normal.        Behavior: Behavior normal.        Thought Content: Thought content normal.  Judgment: Judgment normal.      Depression Screen PHQ 2/9 Scores 10/09/2019 09/13/2018 03/20/2018 06/02/2016  PHQ - 2 Score 0 0 0 0       Assessment & Plan:     Routine Health Maintenance and Physical Exam  Exercise Activities and Dietary recommendations Goals   None     Immunization History  Administered Date(s) Administered  . DTaP 07/09/1997, 09/03/1997, 10/29/1997, 09/15/1999, 12/11/2001  . HPV Quadrivalent 03/29/2012, 03/31/2013, 04/07/2014  . Hepatitis A 02/20/2008, 04/01/2009  . Hepatitis B 07/09/1997, 09/03/1997, 09/15/1999  . HiB (PRP-OMP) 07/09/1997, 09/13/1997, 10/29/1997, 11/29/1998  . IPV 07/09/1997, 09/03/1997, 11/29/1998, 12/11/2001  . MMR 11/29/1998, 12/11/2001  . Meningococcal B, OMV 04/23/2015, 02/16/2016  . Meningococcal Conjugate 04/23/2015  . Td 10/09/2019  . Tdap 02/20/2008  . Varicella 09/15/1999, 02/20/2008    Health Maintenance  Topic Date Due  . HIV Screening  05/28/2012  . INFLUENZA VACCINE  03/29/2019  . TETANUS/TDAP  10/08/2029     Discussed health benefits of physical activity, and encouraged him to engage in regular exercise appropriate for his age and condition.    1. Annual physical exam Normal exam today. 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   2. Need for tetanus booster Td booster Vaccine given to patient without complications. Patient sat for 15 minutes after administration and was tolerated well without adverse effects. - Td : Tetanus/diphtheria >7yo Preservative  free  3. Exercise-induced asthma Stable. More induced from smoke than exercise. Uses albuterol inhaler prn.   4. BMI 27.0-27.9,adult Patient is very physically fit with good muscle tone. BMI more increased due to increased muscle tone, not overweight. He exercises daily and eats a healthy, well-balanced diet.   --------------------------------------------------------------------    Mar Daring, PA-C  [facility]

## 2019-10-09 ENCOUNTER — Encounter: Payer: Self-pay | Admitting: Physician Assistant

## 2019-10-09 ENCOUNTER — Other Ambulatory Visit: Payer: Self-pay

## 2019-10-09 ENCOUNTER — Ambulatory Visit (INDEPENDENT_AMBULATORY_CARE_PROVIDER_SITE_OTHER): Payer: 59 | Admitting: Physician Assistant

## 2019-10-09 VITALS — BP 135/75 | HR 66 | Temp 96.9°F | Resp 16 | Ht 65.5 in | Wt 165.6 lb

## 2019-10-09 DIAGNOSIS — Z0000 Encounter for general adult medical examination without abnormal findings: Secondary | ICD-10-CM | POA: Diagnosis not present

## 2019-10-09 DIAGNOSIS — J45990 Exercise induced bronchospasm: Secondary | ICD-10-CM | POA: Diagnosis not present

## 2019-10-09 DIAGNOSIS — Z6827 Body mass index (BMI) 27.0-27.9, adult: Secondary | ICD-10-CM | POA: Diagnosis not present

## 2019-10-09 DIAGNOSIS — Z23 Encounter for immunization: Secondary | ICD-10-CM | POA: Diagnosis not present

## 2019-10-09 NOTE — Patient Instructions (Signed)

## 2020-01-03 IMAGING — CR DG CHEST 2V
2 series · 2 of 2 positions shown · non-contrast
Comparison: None.

CLINICAL DATA: 22 y/o M; shortness of breath, cough, sensation like
and asthma attack.

EXAM:
CHEST - 2 VIEW

[chest pa]
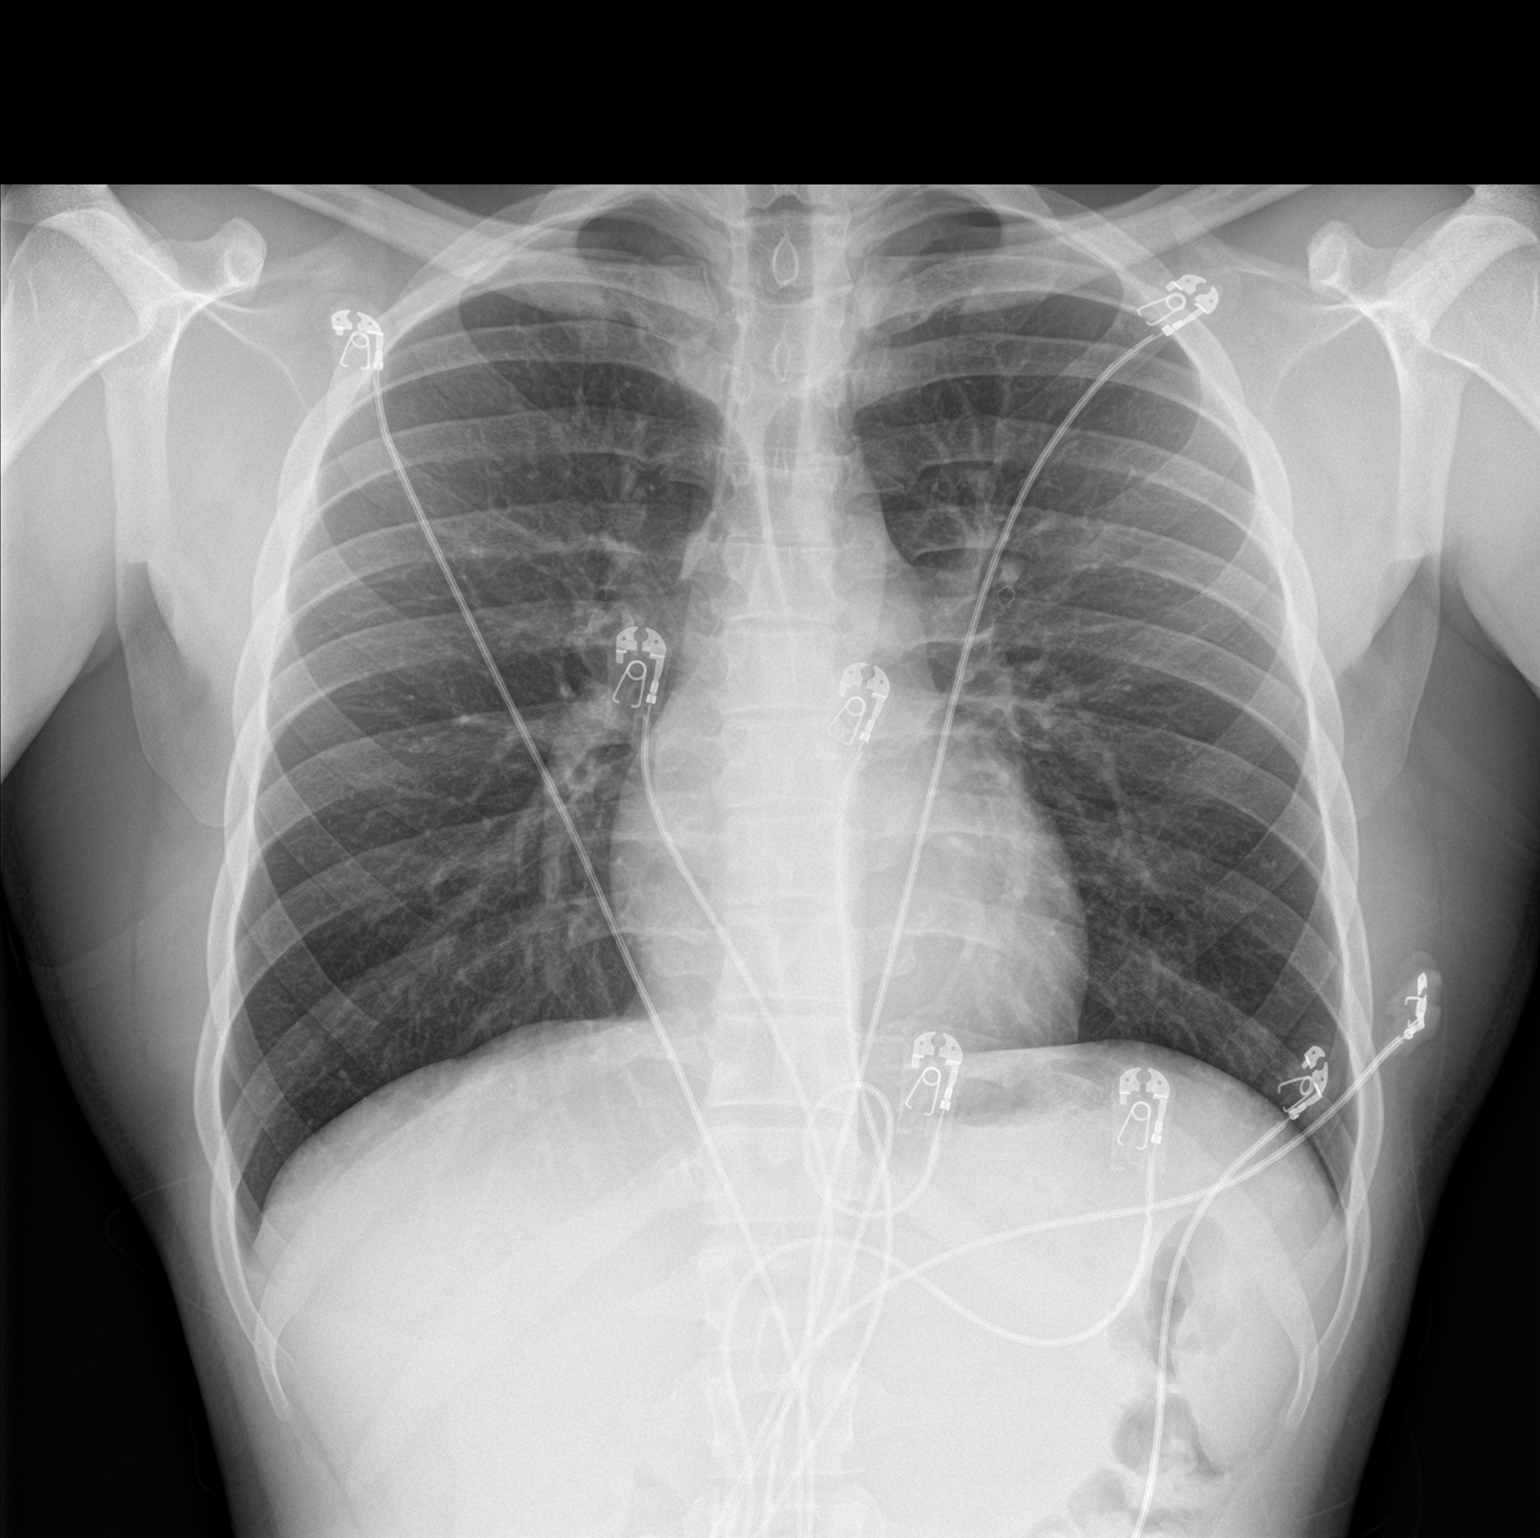

[chest lat]
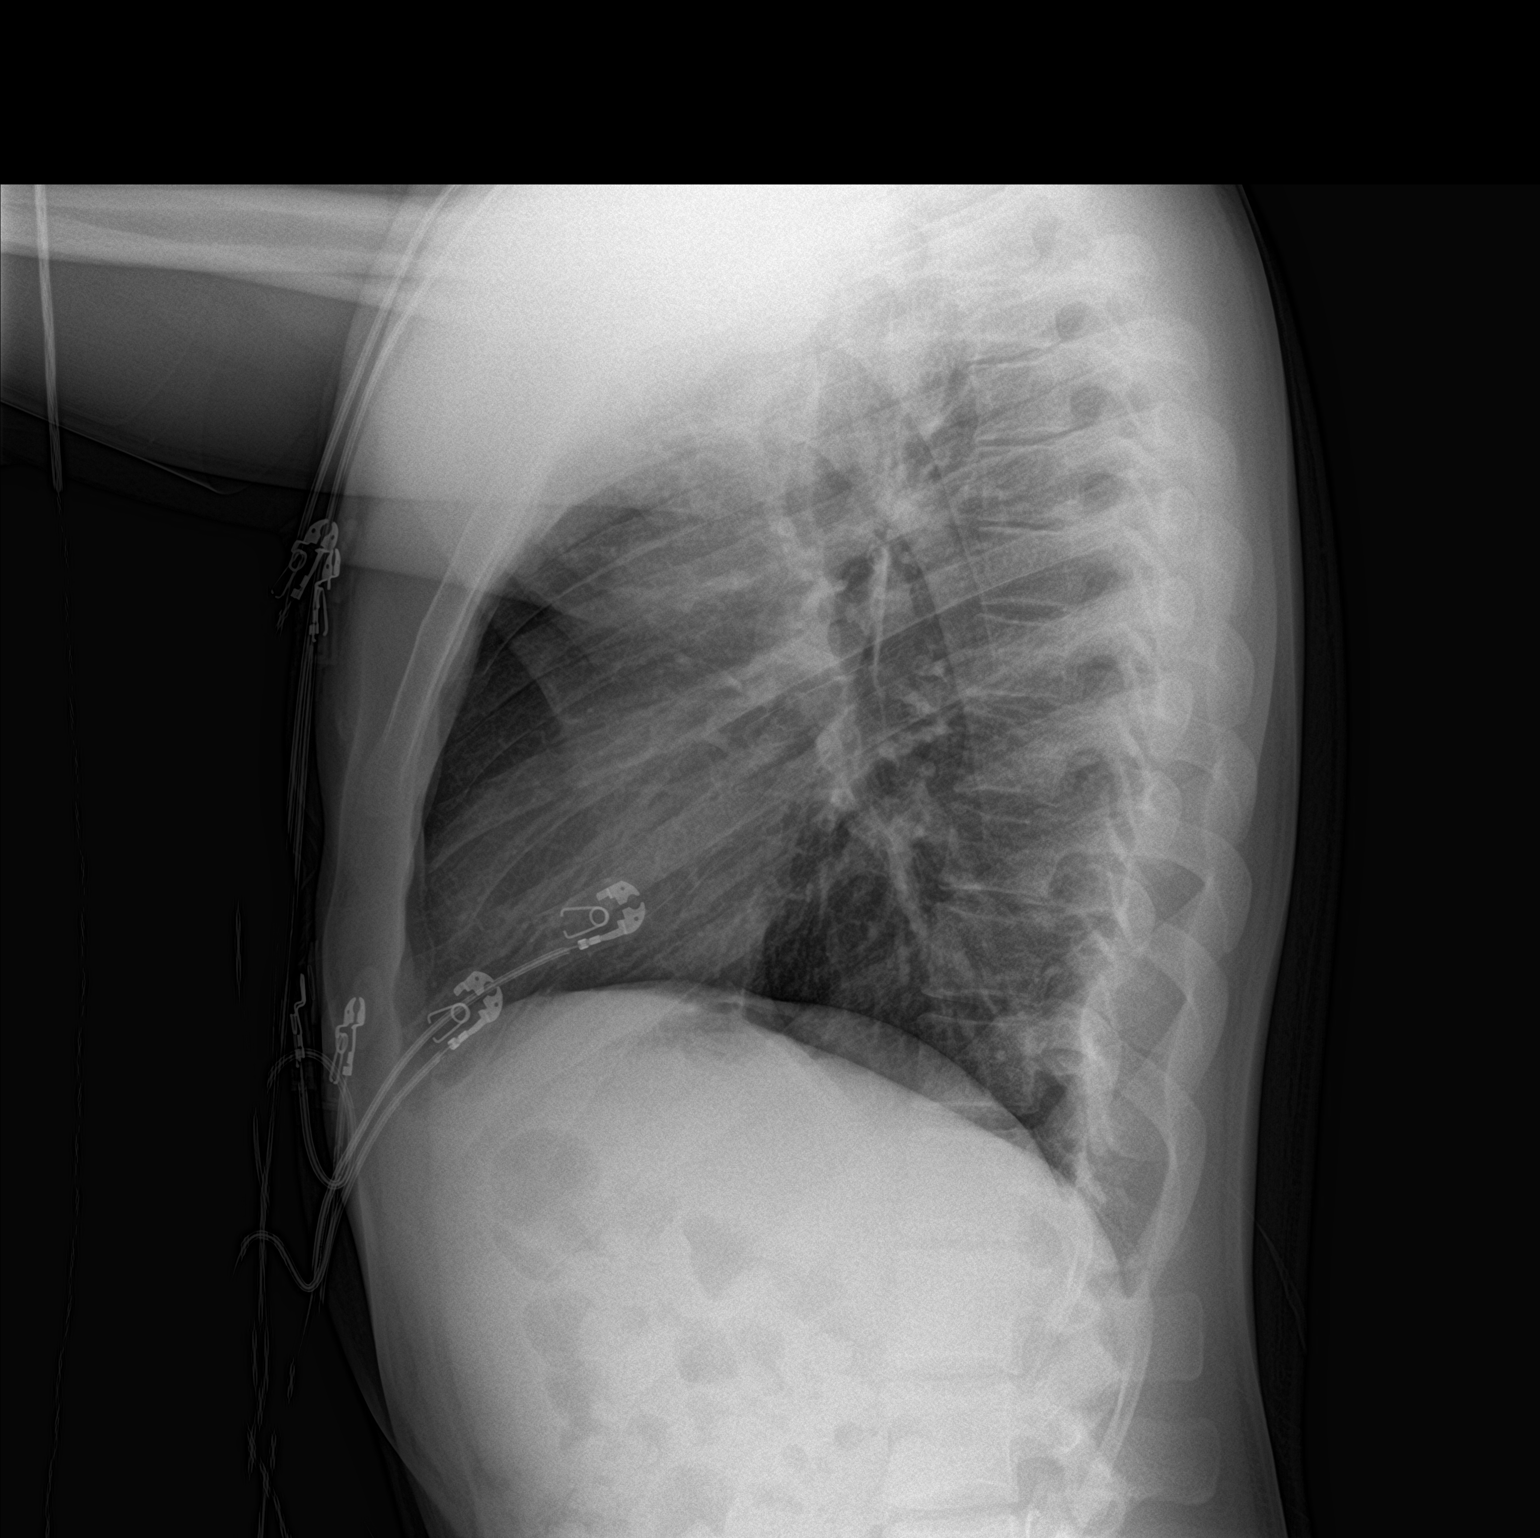

[2 of 2 positions shown; findings below may reference images not displayed]

FINDINGS: The heart size and mediastinal contours are within normal limits.
Both lungs are clear. The visualized skeletal structures are
unremarkable.
IMPRESSION: No acute pulmonary process identified.

## 2021-10-12 ENCOUNTER — Telehealth: Payer: Self-pay

## 2021-10-12 NOTE — Telephone Encounter (Signed)
Contacted Brent Ingram and let her know I would leave a copy at front desk for pick up. KW

## 2021-10-12 NOTE — Telephone Encounter (Signed)
Copied from CRM [id number]. Topic: General - Other >> Oct 12, 2021 10:14 AM Darron Doom wrote: Reason for CRM: Patient mom Brent Ingram called in needing a copy of his immunization record since he is not around and need this for an employer  either Brent Ingram or Bertis Ruddy will pick it up when ready. Please call Ph# [phone]
